# Patient Record
Sex: Female | Born: 1965 | Race: White | Hispanic: No | Marital: Married | Smoking: Never smoker
Health system: Southern US, Community
[De-identification: ages and names within clinical notes are randomized; demographics above are authoritative.]

## PROBLEM LIST (undated history)

## (undated) DIAGNOSIS — O99345 Other mental disorders complicating the puerperium: Secondary | ICD-10-CM

## (undated) DIAGNOSIS — I341 Nonrheumatic mitral (valve) prolapse: Secondary | ICD-10-CM

## (undated) DIAGNOSIS — F53 Postpartum depression: Secondary | ICD-10-CM

## (undated) DIAGNOSIS — O903 Peripartum cardiomyopathy: Secondary | ICD-10-CM

## (undated) DIAGNOSIS — K429 Umbilical hernia without obstruction or gangrene: Secondary | ICD-10-CM

## (undated) DIAGNOSIS — F339 Major depressive disorder, recurrent, unspecified: Secondary | ICD-10-CM

## (undated) DIAGNOSIS — R5383 Other fatigue: Secondary | ICD-10-CM

## (undated) DIAGNOSIS — R002 Palpitations: Secondary | ICD-10-CM

## (undated) DIAGNOSIS — I38 Endocarditis, valve unspecified: Secondary | ICD-10-CM

## (undated) DIAGNOSIS — O24419 Gestational diabetes mellitus in pregnancy, unspecified control: Secondary | ICD-10-CM

## (undated) DIAGNOSIS — Z148 Genetic carrier of other disease: Secondary | ICD-10-CM

## (undated) HISTORY — DX: Nonrheumatic mitral (valve) prolapse: I34.1

## (undated) HISTORY — DX: Genetic carrier of other disease: Z14.8

## (undated) HISTORY — DX: Gestational diabetes mellitus in pregnancy, unspecified control: O24.419

## (undated) HISTORY — DX: Other fatigue: R53.83

## (undated) HISTORY — DX: Palpitations: R00.2

## (undated) HISTORY — DX: Postpartum depression: F53.0

## (undated) HISTORY — DX: Major depressive disorder, recurrent, unspecified: F33.9

## (undated) HISTORY — DX: Umbilical hernia without obstruction or gangrene: K42.9

## (undated) HISTORY — DX: Endocarditis, valve unspecified: I38

## (undated) HISTORY — DX: Peripartum cardiomyopathy: O90.3

## (undated) HISTORY — DX: Other mental disorders complicating the puerperium: O99.345

---

## 2007-05-31 HISTORY — PX: UMBILICAL HERNIA REPAIR: SHX196

## 2007-05-31 HISTORY — PX: ABLATION: SHX5711

## 2009-12-14 DIAGNOSIS — I341 Nonrheumatic mitral (valve) prolapse: Secondary | ICD-10-CM

## 2009-12-14 HISTORY — DX: Nonrheumatic mitral (valve) prolapse: I34.1

## 2010-10-06 ENCOUNTER — Other Ambulatory Visit (HOSPITAL_COMMUNITY)
Admission: RE | Admit: 2010-10-06 | Discharge: 2010-10-06 | Disposition: A | Discharge status: Home / Self Care | Payer: 59 | Source: Ambulatory Visit | Attending: Family Medicine | Admitting: Family Medicine

## 2010-10-06 DIAGNOSIS — Z124 Encounter for screening for malignant neoplasm of cervix: Secondary | ICD-10-CM | POA: Insufficient documentation

## 2011-11-01 ENCOUNTER — Other Ambulatory Visit (HOSPITAL_COMMUNITY)
Admission: RE | Admit: 2011-11-01 | Discharge: 2011-11-01 | Disposition: A | Discharge status: Home / Self Care | Payer: 59 | Source: Ambulatory Visit | Attending: Family Medicine | Admitting: Family Medicine

## 2011-11-01 DIAGNOSIS — Z1159 Encounter for screening for other viral diseases: Secondary | ICD-10-CM | POA: Insufficient documentation

## 2011-11-01 DIAGNOSIS — Z124 Encounter for screening for malignant neoplasm of cervix: Secondary | ICD-10-CM | POA: Insufficient documentation

## 2011-11-14 ENCOUNTER — Other Ambulatory Visit: Payer: Self-pay | Admitting: Family Medicine

## 2011-11-14 DIAGNOSIS — Z1231 Encounter for screening mammogram for malignant neoplasm of breast: Secondary | ICD-10-CM

## 2011-12-07 ENCOUNTER — Ambulatory Visit
Admission: RE | Admit: 2011-12-07 | Discharge: 2011-12-07 | Disposition: A | Discharge status: Home / Self Care | Payer: 59 | Source: Ambulatory Visit | Attending: Family Medicine | Admitting: Family Medicine

## 2011-12-07 DIAGNOSIS — Z1231 Encounter for screening mammogram for malignant neoplasm of breast: Secondary | ICD-10-CM

## 2011-12-20 ENCOUNTER — Other Ambulatory Visit: Payer: Self-pay | Admitting: Family Medicine

## 2011-12-20 DIAGNOSIS — R928 Other abnormal and inconclusive findings on diagnostic imaging of breast: Secondary | ICD-10-CM

## 2011-12-26 ENCOUNTER — Ambulatory Visit
Admission: RE | Admit: 2011-12-26 | Discharge: 2011-12-26 | Disposition: A | Discharge status: Home / Self Care | Payer: 59 | Source: Ambulatory Visit | Attending: Family Medicine | Admitting: Family Medicine

## 2011-12-26 DIAGNOSIS — R928 Other abnormal and inconclusive findings on diagnostic imaging of breast: Secondary | ICD-10-CM

## 2012-12-31 ENCOUNTER — Other Ambulatory Visit: Payer: Self-pay

## 2012-12-31 DIAGNOSIS — Z1231 Encounter for screening mammogram for malignant neoplasm of breast: Secondary | ICD-10-CM

## 2013-02-19 ENCOUNTER — Ambulatory Visit: Payer: 59

## 2013-03-04 ENCOUNTER — Ambulatory Visit
Admission: RE | Admit: 2013-03-04 | Discharge: 2013-03-04 | Disposition: A | Discharge status: Home / Self Care | Payer: BC Managed Care – PPO | Source: Ambulatory Visit

## 2013-03-04 DIAGNOSIS — Z1231 Encounter for screening mammogram for malignant neoplasm of breast: Secondary | ICD-10-CM

## 2013-04-11 ENCOUNTER — Other Ambulatory Visit: Payer: Self-pay | Admitting: Physician Assistant

## 2013-04-11 DIAGNOSIS — R109 Unspecified abdominal pain: Secondary | ICD-10-CM

## 2013-04-15 ENCOUNTER — Ambulatory Visit
Admission: RE | Admit: 2013-04-15 | Discharge: 2013-04-15 | Disposition: A | Discharge status: Home / Self Care | Payer: BC Managed Care – PPO | Source: Ambulatory Visit | Attending: Physician Assistant | Admitting: Physician Assistant

## 2013-04-15 DIAGNOSIS — R109 Unspecified abdominal pain: Secondary | ICD-10-CM

## 2013-04-15 MED ORDER — IOHEXOL 300 MG/ML  SOLN
100.0000 mL | Freq: Once | INTRAMUSCULAR | Status: AC | PRN
  Administered 2013-04-15: 100 mL via INTRAVENOUS

## 2013-08-02 ENCOUNTER — Encounter: Payer: Self-pay | Admitting: Interventional Cardiology

## 2013-09-06 ENCOUNTER — Ambulatory Visit: Payer: 59 | Admitting: Interventional Cardiology

## 2013-10-23 ENCOUNTER — Ambulatory Visit: Payer: BC Managed Care – PPO | Admitting: Interventional Cardiology

## 2013-11-12 ENCOUNTER — Encounter: Payer: Self-pay | Admitting: Interventional Cardiology

## 2013-11-12 ENCOUNTER — Ambulatory Visit (INDEPENDENT_AMBULATORY_CARE_PROVIDER_SITE_OTHER): Payer: BC Managed Care – PPO | Admitting: Interventional Cardiology

## 2013-11-12 VITALS — BP 99/63 | HR 67 | Ht 66.0 in | Wt 137.8 lb

## 2013-11-12 DIAGNOSIS — I059 Rheumatic mitral valve disease, unspecified: Secondary | ICD-10-CM

## 2013-11-12 DIAGNOSIS — I341 Nonrheumatic mitral (valve) prolapse: Secondary | ICD-10-CM

## 2013-11-12 NOTE — Patient Instructions (Signed)
Your physician wants you to follow-up in: 2 years with Dr. Eldridge DaceVaranasi. You will receive a reminder letter in the mail two months in advance. If you don't receive a letter, please call our office to schedule the follow-up appointment.

## 2013-11-12 NOTE — Progress Notes (Signed)
Patient ID: Savannah Schmitt, female   DOB: 12/19/1965, 48 y.o.   MRN: [id number]    [address] [place], [place]  [id number] Phone: [phone] Fax:  [phone]  Date:  11/12/2013   ID:  Savannah Schmitt, DOB 03/20/1966, MRN [id number]  PCP:  Beverley FiedlerANKINS,VICTORIA, MD      History of Present Illness: Savannah GrayerKristin Schmitt is a 48 y.o. 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 She had her last child in 2008. She does not recall having any heart failure symptoms. She doesn't recall being on any regimen of medications for her heart. Review of the record shows that her ejection fraction as always listed above 55% in the past. She does have a thickened mitral valve but no significant mitral valve regurgitation is noted.  Echo in 2014 showed normal left jugular function with mild mitral valve prolapse and mild mitral regurgitation. She denies any symptoms of chest discomfort or shortness of breath. She exercises regularly. She has been feeling well over the past year. No palpitations, orthopnea or PND.    Wt Readings from Last 3 Encounters:  11/12/13 137 lb 12.8 oz (62.506 kg)     Past Medical History  Diagnosis Date  . Umbilical hernia     s/p repair in 2009  . Major depression, recurrent, chronic   . MVP (mitral valve prolapse) 12/14/09    [facility]. ECHO 09/06/12 - LV EF estimated at 55-60%. Mild MVP. Mild mitral valve regurgitation. The mitral regurgitant jet is poteriorly directed.  Pt has a questionable Hx of peripartum cardiomyopathy. Does not recall any heart failure sx.  . Gestational diabetes   . 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 Does not recall any heart failure sx or being on meds for her heart. Records shows that her EF as always listed >55% in the past. Does have a thickened mitral valve but no significant mitral valve regurgitation noted.  .  Palpitations     24 hour Holter monitor, 09/04/06: a) Rare PVCs & PACs.  b) Sx improved despite stopping Metoprolol.    . Valvular heart disease     a) Anterior mitral leaflet prolapse with EF of 66%. (06/02/06)  . Post partum depression     Resolved as of 02/14/07  . Fatigue     Current Outpatient Prescriptions  Medication Sig Dispense Refill  . Fluticasone Propionate (FLONASE NA) Place into the nose daily.      . Multiple Vitamin (MULTIVITAMIN) tablet Take 1 tablet by mouth as directed.      . Probiotic Product (PROBIOTIC DAILY PO)        No current facility-administered medications for this visit.    Allergies:    Allergies  Allergen Reactions  . Wellbutrin [Bupropion] Other (See Comments)    Negative therapeutic response    Social History:  The patient  reports that she has never smoked. She does not have any smokeless tobacco history on file. She reports that she drinks alcohol. She reports that she does not use illicit drugs.   Family History:  The patient's family history includes Colon polyps in her paternal grandfather; Diverticulitis in her father; Hypertension in her other; Melanoma in her father; Other in her daughter.   ROS:  Please see the history of present illness.  No nausea, vomiting.  No fevers, chills.  No focal weakness.  No dysuria.  All other systems reviewed and negative.   PHYSICAL EXAM: VS:  BP 99/63  Pulse 67  Ht 5\' 6"  (1.676 m)  Wt 137 lb 12.8 oz (62.506 kg)  BMI 22.25 kg/m2 Well nourished, well developed, in no acute distress HEENT: normal Neck: no JVD, no carotid bruits Cardiac:  normal S1, S2; RRR;  Lungs:  clear to auscultation bilaterally, no wheezing, rhonchi or rales Abd: soft, nontender, no hepatomegaly Ext: no edema Skin: warm and dry Neuro:   no focal abnormalities noted  EKG:  Normal sinus rhythm, short PR interval, nonspecific ST segment changes     ASSESSMENT AND PLAN:  Mitral valve prolapse  IMAGING: EKG   Harward,Amy  08/27/2012 02:34:07 PM > VARANASI,JAY 08/27/2012 03:12:22 PM > NSR, short PR, NSST  IMAGING: EC Echocardiogram from 2014: Noted above.  Notes: No symptoms of severe mitral regurgitation. No heart failure symptoms. No need for antibiotics at the time of dental procedures.   She needs annual visits with her physician. She sees her PMD at least once a year. If there is any increase in intensity of the murmur or any symptoms of heart failure, would consider repeat evaluation. Otherwise, I will see her back in a few years. She likely needs an echocardiogram in about 3 years.  Is unclear to me whether she actually had peripartum cardiomyopathy. Regardless, her ejection fraction is now normal. She is not planning on having any more children. Would not add anymore medication at this time as her blood pressure would likely not tolerate anything.   Signed, Fredric MareJay S. Varanasi, MD, [facility] 11/12/2013 12:17 PM

## 2014-03-03 ENCOUNTER — Other Ambulatory Visit: Payer: Self-pay

## 2014-03-03 DIAGNOSIS — Z1231 Encounter for screening mammogram for malignant neoplasm of breast: Secondary | ICD-10-CM

## 2014-03-27 ENCOUNTER — Ambulatory Visit
Admission: RE | Admit: 2014-03-27 | Discharge: 2014-03-27 | Disposition: A | Discharge status: Home / Self Care | Payer: BC Managed Care – PPO | Source: Ambulatory Visit

## 2014-03-27 DIAGNOSIS — Z1231 Encounter for screening mammogram for malignant neoplasm of breast: Secondary | ICD-10-CM

## 2014-11-02 IMAGING — CT CT ABDOMEN W/ CM
3 of 5 series · 13 of 36 positions shown, 19 images · IV contrast (READICAT/WATER & [ID] OMNI 300)
Comparison: None.

CLINICAL DATA: Umbilical abdominal pain.

EXAM:
CT ABDOMEN WITH CONTRAST
TECHNIQUE: Multidetector CT imaging of the abdomen was performed using the
standard protocol following bolus administration of intravenous
contrast.
CONTRAST:  100mL OMNIPAQUE IOHEXOL 300 MG/ML  SOLN

[Series 3: abdomen with · axial · 0.70mm/px · z∈[-246,-46]mm · 5 of 61 slices shown, 10 images]
[im 11/61  soft-tissue]
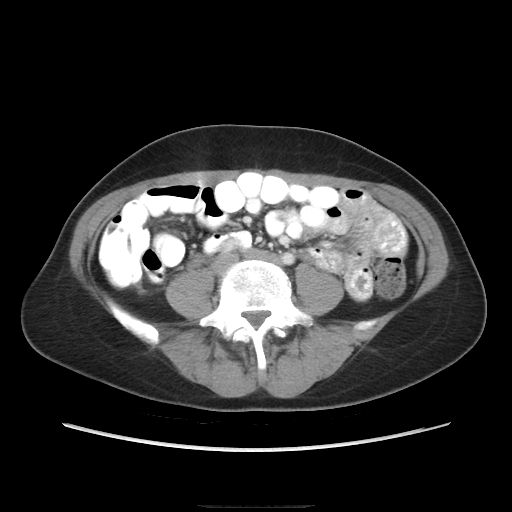
[im 11/61  bone]
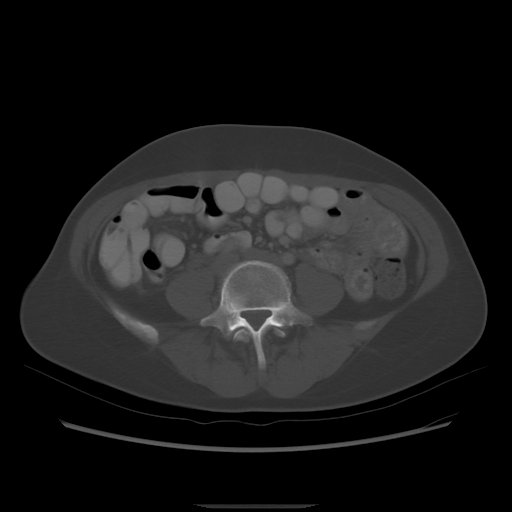
[im 21/61  soft-tissue]
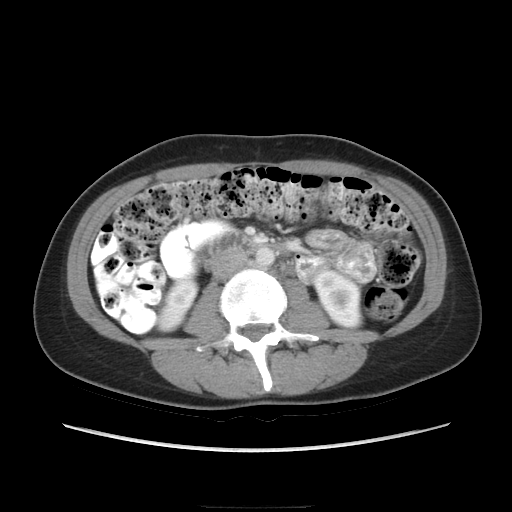
[im 21/61  lung]
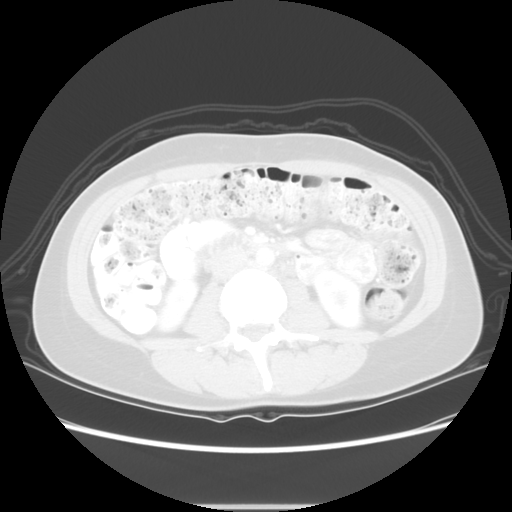
[im 31/61  soft-tissue]
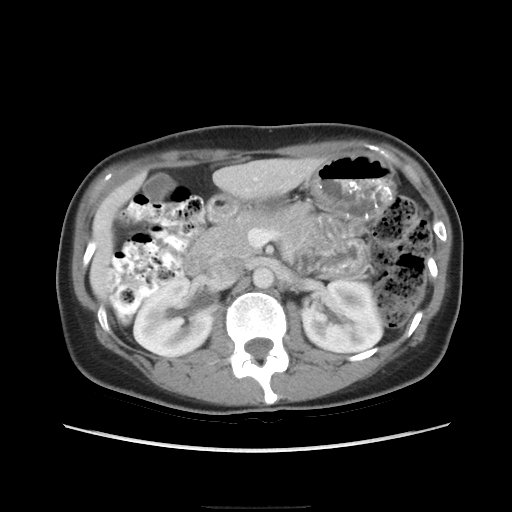
[im 31/61  lung]
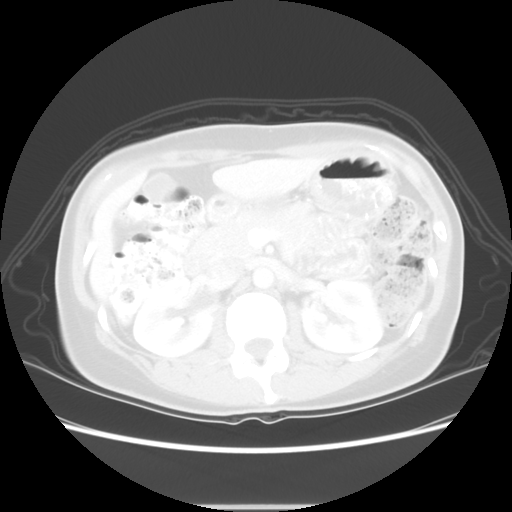
[im 41/61  soft-tissue]
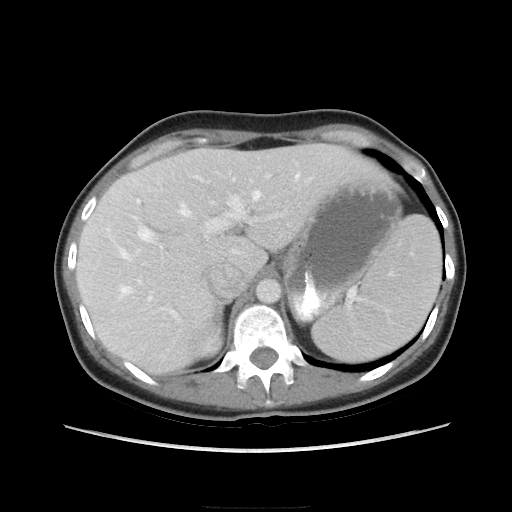
[im 41/61  lung]
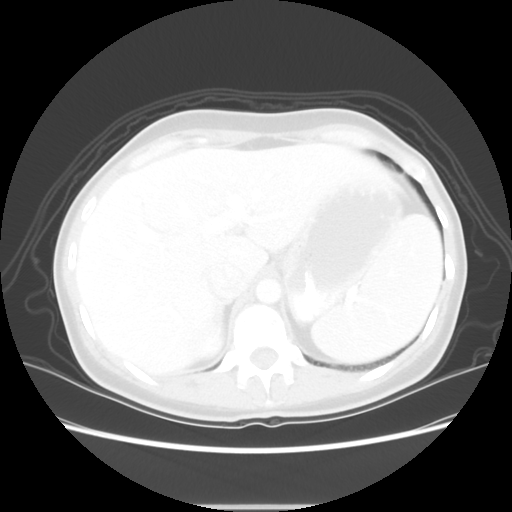
[im 51/61  soft-tissue]
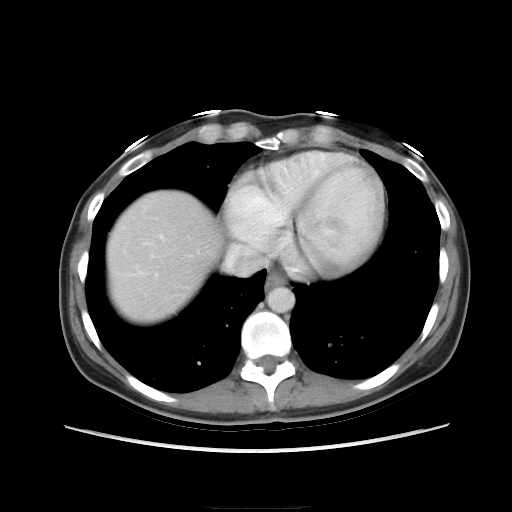
[im 51/61  lung]
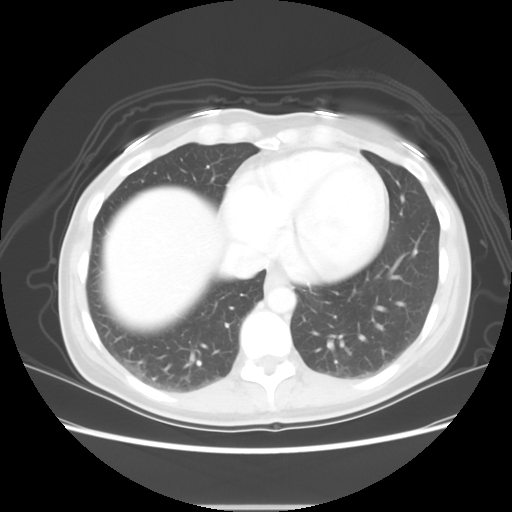

[Series 601: coronal body · coronal · 0.70mm/px · 1 of 98 slices shown, 2 images]
[im 33/98  soft-tissue]
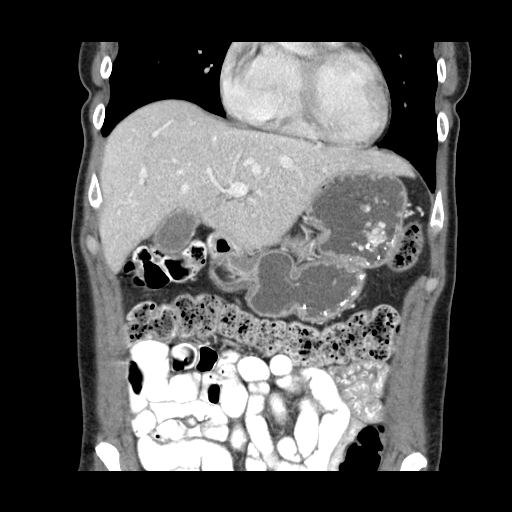
[im 33/98  bone]
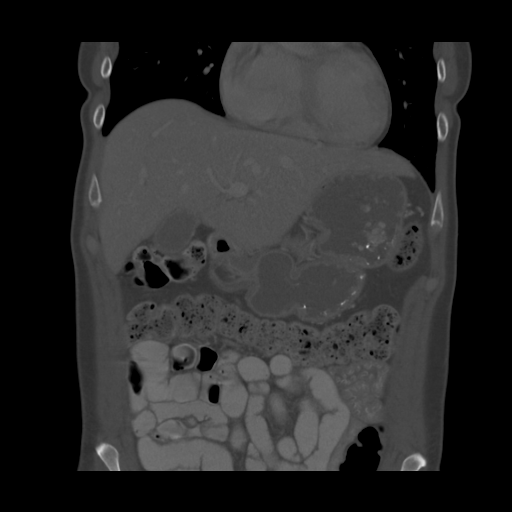

[Series 602: sagittal body · sagittal · 0.70mm/px · 7 of 139 slices shown]
[im 17/139  soft-tissue]
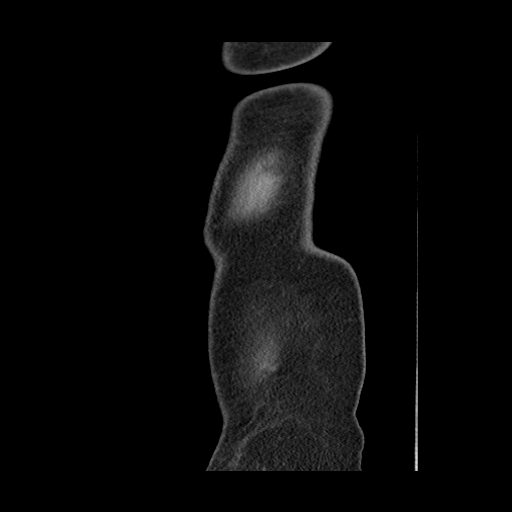
[im 33/139  soft-tissue]
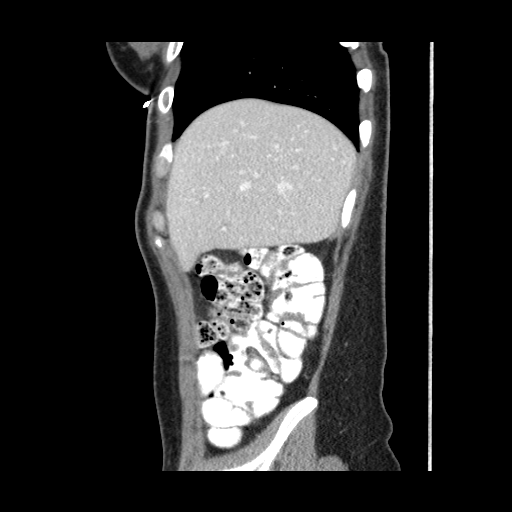
[im 49/139  soft-tissue]
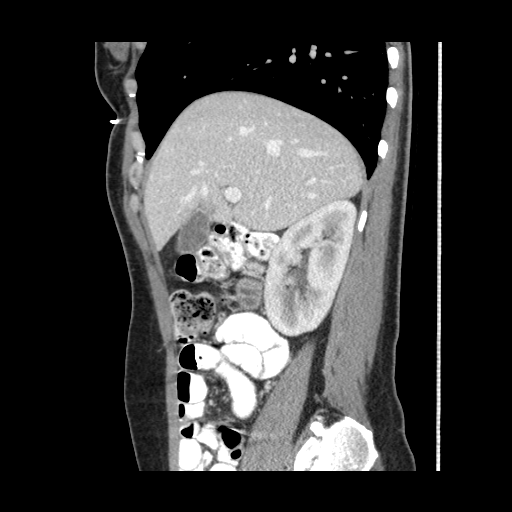
[im 65/139  soft-tissue]
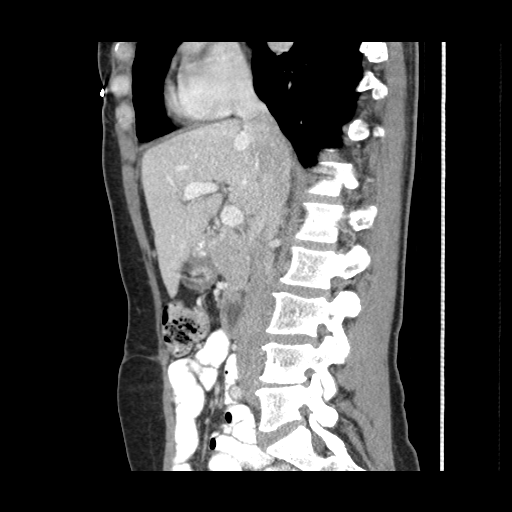
[im 82/139  soft-tissue]
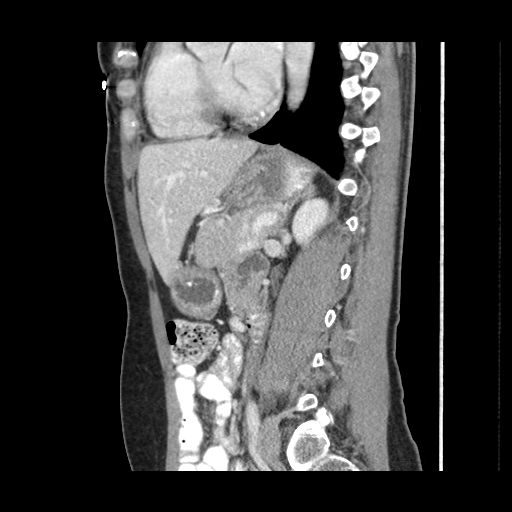
[im 98/139  soft-tissue]
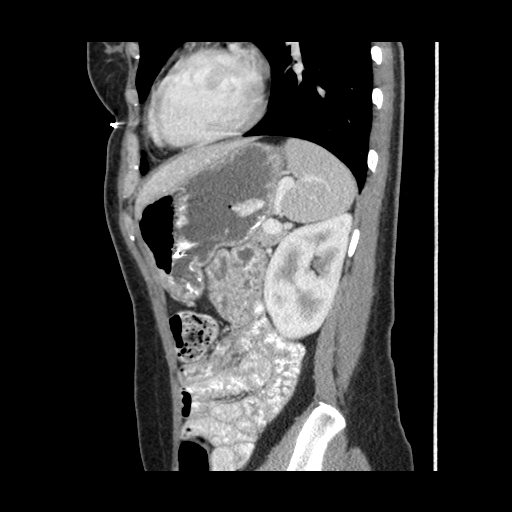
[im 114/139  soft-tissue]
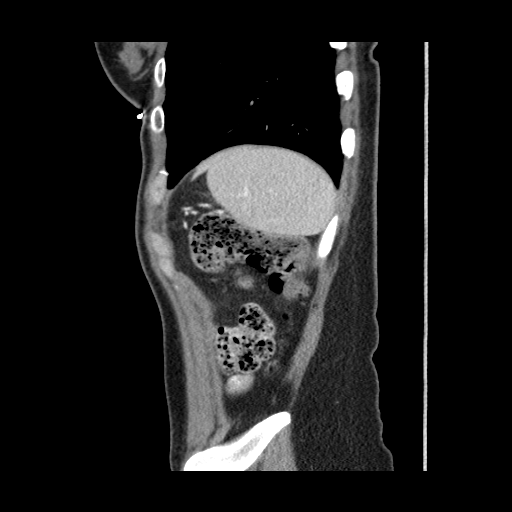

[13 of 36 positions shown; findings below may reference images not displayed]

FINDINGS: Lung bases are clear. No effusions. Heart is normal size.

Liver, gallbladder, spleen, pancreas, adrenals and kidneys are
unremarkable. No abnormality in the region of the umbilicus or
adjacent soft tissues. Moderate stool in the visualize colon. Large
and small bowel otherwise unremarkable. Stomach is unremarkable. No
free fluid, free air or adenopathy in the abdomen. Aorta is normal
caliber.

No acute bony abnormality.
IMPRESSION: No acute findings or significant abnormality in the abdomen. No
explanation for the patient's umbilical pain.

## 2015-03-17 ENCOUNTER — Other Ambulatory Visit: Payer: Self-pay

## 2015-03-17 DIAGNOSIS — Z1231 Encounter for screening mammogram for malignant neoplasm of breast: Secondary | ICD-10-CM

## 2015-04-22 ENCOUNTER — Ambulatory Visit: Payer: Self-pay

## 2015-06-05 ENCOUNTER — Ambulatory Visit
Admission: RE | Admit: 2015-06-05 | Discharge: 2015-06-05 | Disposition: A | Discharge status: Home / Self Care | Payer: Managed Care, Other (non HMO) | Source: Ambulatory Visit

## 2015-06-05 DIAGNOSIS — Z1231 Encounter for screening mammogram for malignant neoplasm of breast: Secondary | ICD-10-CM

## 2016-05-13 ENCOUNTER — Other Ambulatory Visit: Payer: Self-pay | Admitting: Family Medicine

## 2016-05-13 DIAGNOSIS — Z1231 Encounter for screening mammogram for malignant neoplasm of breast: Secondary | ICD-10-CM

## 2016-06-17 ENCOUNTER — Ambulatory Visit
Admission: RE | Admit: 2016-06-17 | Discharge: 2016-06-17 | Disposition: A | Discharge status: Home / Self Care | Payer: Managed Care, Other (non HMO) | Source: Ambulatory Visit | Attending: Family Medicine | Admitting: Family Medicine

## 2016-06-17 DIAGNOSIS — Z1231 Encounter for screening mammogram for malignant neoplasm of breast: Secondary | ICD-10-CM

## 2017-07-27 ENCOUNTER — Ambulatory Visit
Admission: RE | Admit: 2017-07-27 | Discharge: 2017-07-27 | Disposition: A | Discharge status: Home / Self Care | Payer: Managed Care, Other (non HMO) | Source: Ambulatory Visit | Attending: Family Medicine | Admitting: Family Medicine

## 2017-07-27 ENCOUNTER — Other Ambulatory Visit: Payer: Self-pay | Admitting: Family Medicine

## 2017-07-27 DIAGNOSIS — N63 Unspecified lump in unspecified breast: Secondary | ICD-10-CM

## 2017-08-01 ENCOUNTER — Other Ambulatory Visit: Payer: Managed Care, Other (non HMO)

## 2017-08-04 ENCOUNTER — Ambulatory Visit
Admission: RE | Admit: 2017-08-04 | Discharge: 2017-08-04 | Disposition: A | Discharge status: Home / Self Care | Payer: Managed Care, Other (non HMO) | Source: Ambulatory Visit | Attending: Family Medicine | Admitting: Family Medicine

## 2017-08-04 DIAGNOSIS — N63 Unspecified lump in unspecified breast: Secondary | ICD-10-CM

## 2018-08-01 ENCOUNTER — Other Ambulatory Visit: Payer: Self-pay | Admitting: Family Medicine

## 2018-08-01 DIAGNOSIS — Z1231 Encounter for screening mammogram for malignant neoplasm of breast: Secondary | ICD-10-CM

## 2018-08-29 ENCOUNTER — Ambulatory Visit: Payer: Managed Care, Other (non HMO)

## 2018-10-19 ENCOUNTER — Other Ambulatory Visit: Payer: Self-pay

## 2018-10-19 ENCOUNTER — Ambulatory Visit
Admission: RE | Admit: 2018-10-19 | Discharge: 2018-10-19 | Disposition: A | Discharge status: Home / Self Care | Payer: Managed Care, Other (non HMO) | Source: Ambulatory Visit | Attending: Family Medicine | Admitting: Family Medicine

## 2018-10-19 DIAGNOSIS — Z1231 Encounter for screening mammogram for malignant neoplasm of breast: Secondary | ICD-10-CM | POA: Diagnosis not present

## 2018-11-21 DIAGNOSIS — Z86018 Personal history of other benign neoplasm: Secondary | ICD-10-CM | POA: Diagnosis not present

## 2018-11-21 DIAGNOSIS — D2262 Melanocytic nevi of left upper limb, including shoulder: Secondary | ICD-10-CM | POA: Diagnosis not present

## 2018-11-21 DIAGNOSIS — D225 Melanocytic nevi of trunk: Secondary | ICD-10-CM | POA: Diagnosis not present

## 2018-11-21 DIAGNOSIS — D485 Neoplasm of uncertain behavior of skin: Secondary | ICD-10-CM | POA: Diagnosis not present

## 2018-11-21 DIAGNOSIS — Z808 Family history of malignant neoplasm of other organs or systems: Secondary | ICD-10-CM | POA: Diagnosis not present

## 2018-12-18 DIAGNOSIS — Z8349 Family history of other endocrine, nutritional and metabolic diseases: Secondary | ICD-10-CM | POA: Diagnosis not present

## 2018-12-18 DIAGNOSIS — F324 Major depressive disorder, single episode, in partial remission: Secondary | ICD-10-CM | POA: Diagnosis not present

## 2018-12-20 DIAGNOSIS — D485 Neoplasm of uncertain behavior of skin: Secondary | ICD-10-CM | POA: Diagnosis not present

## 2018-12-20 DIAGNOSIS — L905 Scar conditions and fibrosis of skin: Secondary | ICD-10-CM | POA: Diagnosis not present

## 2018-12-25 DIAGNOSIS — F324 Major depressive disorder, single episode, in partial remission: Secondary | ICD-10-CM | POA: Diagnosis not present

## 2019-01-08 DIAGNOSIS — G4485 Primary stabbing headache: Secondary | ICD-10-CM | POA: Diagnosis not present

## 2019-01-28 ENCOUNTER — Telehealth: Payer: Self-pay | Admitting: *Deleted

## 2019-01-28 NOTE — Telephone Encounter (Signed)
Called spoke with patient. She states that she was told by her primary we would be calling for appointment. She is scheduled with Dr. Vaughan Browner at 1000 on Thursday 01/31/19. Nothing further is needed at this time.

## 2019-01-28 NOTE — Telephone Encounter (Signed)
-----   Message from Marshell Garfinkel, MD sent at 01/28/2019  3:07 PM EDT ----- Regarding: RE: Alpha 1 Antytripsen Ok. Thanks. We will make an appointment to see [place]. Triage- Please call and make new consult appointment.  ----- Message ----- From: Clarene Essex, Counselor Sent: 01/28/2019   2:56 PM EDT To: Brand Males, MD, Marshell Garfinkel, MD Subject: Alpha 1 Antytripsen                            This is the patient with A1A.  She has supposedly tested positive at her PCP office and was referred to [place].  I do not have any referral information other than name, as she called following up on the referral.  Santiago Glad

## 2019-01-30 ENCOUNTER — Encounter: Payer: BLUE CROSS/BLUE SHIELD | Admitting: Genetic Counselor

## 2019-01-30 ENCOUNTER — Other Ambulatory Visit: Payer: BLUE CROSS/BLUE SHIELD

## 2019-01-31 ENCOUNTER — Institutional Professional Consult (permissible substitution): Payer: Self-pay | Admitting: Pulmonary Disease

## 2019-01-31 ENCOUNTER — Encounter: Payer: Self-pay | Admitting: Pulmonary Disease

## 2019-01-31 ENCOUNTER — Ambulatory Visit (INDEPENDENT_AMBULATORY_CARE_PROVIDER_SITE_OTHER): Payer: BC Managed Care – PPO | Admitting: Pulmonary Disease

## 2019-01-31 ENCOUNTER — Ambulatory Visit (INDEPENDENT_AMBULATORY_CARE_PROVIDER_SITE_OTHER): Payer: BC Managed Care – PPO

## 2019-01-31 ENCOUNTER — Other Ambulatory Visit: Payer: Self-pay

## 2019-01-31 VITALS — BP 106/66 | HR 68 | Ht 65.0 in | Wt 151.4 lb

## 2019-01-31 DIAGNOSIS — R0602 Shortness of breath: Secondary | ICD-10-CM | POA: Diagnosis not present

## 2019-01-31 LAB — COMPREHENSIVE METABOLIC PANEL
ALT: 13 U/L (ref 0–35)
AST: 13 U/L (ref 0–37)
Albumin: 4.5 g/dL (ref 3.5–5.2)
Alkaline Phosphatase: 39 U/L (ref 39–117)
BUN: 17 mg/dL (ref 6–23)
CO2: 28 mEq/L (ref 19–32)
Calcium: 9.4 mg/dL (ref 8.4–10.5)
Chloride: 105 mEq/L (ref 96–112)
Creatinine, Ser: 0.75 mg/dL (ref 0.40–1.20)
GFR: 81.44 mL/min (ref >60.00)
Glucose, Bld: 87 mg/dL (ref 70–99)
Potassium: 3.9 mEq/L (ref 3.5–5.1)
Sodium: 138 mEq/L (ref 135–145)
Total Bilirubin: 0.5 mg/dL (ref 0.2–1.2)
Total Protein: 6.9 g/dL (ref 6.0–8.3)

## 2019-01-31 LAB — CBC WITH DIFFERENTIAL/PLATELET
Basophils Absolute: 0 10*3/uL (ref 0.0–0.1)
Basophils Relative: 0.6 % (ref 0.0–3.0)
Eosinophils Absolute: 0.1 10*3/uL (ref 0.0–0.7)
Eosinophils Relative: 1.2 % (ref 0.0–5.0)
HCT: 39.4 % (ref 36.0–46.0)
Hemoglobin: 13 g/dL (ref 12.0–15.0)
Lymphocytes Relative: 23.5 % (ref 12.0–46.0)
Lymphs Abs: 1.4 10*3/uL (ref 0.7–4.0)
MCHC: 33.1 g/dL (ref 30.0–36.0)
MCV: 96.4 fl (ref 78.0–100.0)
Monocytes Absolute: 0.4 10*3/uL (ref 0.1–1.0)
Monocytes Relative: 7.1 % (ref 3.0–12.0)
Neutro Abs: 4 10*3/uL (ref 1.4–7.7)
Neutrophils Relative %: 67.6 % (ref 43.0–77.0)
Platelets: 172 10*3/uL (ref 150.0–400.0)
RBC: 4.09 Mil/uL (ref 3.87–5.11)
RDW: 14.8 % (ref 11.5–15.5)
WBC: 5.9 10*3/uL (ref 4.0–10.5)

## 2019-01-31 NOTE — Addendum Note (Signed)
Addended by: Suzzanne Cloud E on: 01/31/2019 11:28 AM   Modules accepted: Orders

## 2019-01-31 NOTE — Patient Instructions (Addendum)
We will get some labs today including CBC with differential, alpha-1 antitrypsin levels and phenotype, comprehensive metabolic panel We will schedule you for chest x-ray and pulmonary function tests  Follow-up in 1 year.

## 2019-01-31 NOTE — Addendum Note (Signed)
Addended by: Eileen Stanford on: 01/31/2019 11:37 AM   Modules accepted: Orders

## 2019-01-31 NOTE — Progress Notes (Signed)
Savannah Schmitt    [id number]    1966/01/30  Primary Care Physician:Rankins, Fanny Dance, MD  Referring Physician: Clayborn Heron, MD [address] [place],  [place] [address]  Chief complaint: Consult for evaluation of alpha-1 antitrypsin carrier status  HPI: 53 year old with past medical history of mitral valve prolapse, peripartum cardiomyopathy Has family history of alpha-1 antitrypsin.  Father is ZZ phenotype and is on replacement therapy She had herself tested at primary care and was diagnosed with MZ status. Referred here for evaluation of her lungs and counseling  She is a never smoker.  She has mild dyspnea on exertion with no cough, sputum production, wheezing No evidence of liver issues.  She does not drink alcohol.  No symptoms of skin rash, panniculitis  Pets: 2 cats, dog Occupation: Physical therapist Exposures: No known exposures.  No mold, hot tub, Jacuzzi, humidifier Smoking history: Never smoker.  Exposed to secondhand smoke as a child Travel history: Really from [place].  Lived in [place] for the last 8 years Relevant family history: Father with ZZ alpha-1 antitrypsin deficiency  Outpatient Encounter Medications as of 01/31/2019  Medication Sig  . Fluticasone Propionate (FLONASE NA) Place into the nose daily.  . Multiple Vitamin (MULTIVITAMIN) tablet Take 1 tablet by mouth as directed.  . sertraline (ZOLOFT) 50 MG tablet Take 50 mg by mouth at bedtime. Takes 1/2 tab at bedtime  . Probiotic Product (PROBIOTIC DAILY PO)    No facility-administered encounter medications on file as of 01/31/2019.     Allergies as of 01/31/2019 - Review Complete 01/31/2019  Allergen Reaction Noted  . Wellbutrin [bupropion] Other (See Comments) 11/12/2013    Past Medical History:  Diagnosis Date  . Fatigue   . Gestational diabetes   . Major depression, recurrent, chronic (HCC)   . MVP (mitral valve prolapse) 12/14/09   Mild MVP - Dr Cheryl Flash at [facility] Kitchen ECHO 09/06/12 - LV EF estimated at 55-60%. Mild MVP. Mild mitral valve regurgitation. The mitral regurgitant jet is poteriorly directed.  Pt has a questionable Hx of peripartum cardiomyopathy. Does not recall any heart failure sx.  . Palpitations    24 hour Holter monitor, 09/04/06: a) Rare PVCs & PACs.  b) Sx improved despite stopping Metoprolol.    . Peripartum cardiomyopathy    Questionable Hx of peripartum cardiomyopathy. Does not recall any heart failure sx or being on meds for her heart. Records shows that her EF as always listed >55% in the past. Does have a thickened mitral valve but no significant mitral valve regurgitation noted.  Marland Kitchen Post partum depression    Resolved as of 02/14/07  . Umbilical hernia    s/p repair in 2009  . 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 (06/02/06)    Past Surgical History:  Procedure Laterality Date  . ABLATION  2009   Uterine ablation  . UMBILICAL HERNIA REPAIR  2009    Family History  Problem Relation Age of Onset  . Melanoma Father   . Diverticulitis Father   . Colon polyps Paternal Grandfather   . Other Daughter        Oldest daughter, 23 yrs old (as of 07/2012), has Tri-X Syndrome.  Marland Kitchen Hypertension Other        Family Hx of HTN    Social History   Socioeconomic History  . Marital status: Married    Spouse name: Not on file  .  Number of children: Not on file  . Years of education: Not on file  . Highest education level: Not on file  Occupational History  . Not on file  Social Needs  . Financial resource strain: Not on file  . Food insecurity    Worry: Not on file    Inability: Not on file  . Transportation needs    Medical: Not on file    Non-medical: Not on file  Tobacco Use  . Smoking status: Never Smoker  . Smokeless tobacco: Never Used  Substance and Sexual Activity  . Alcohol use: Yes    Comment: Social  . Drug use: No  . Sexual activity: Not on file  Lifestyle  . Physical  activity    Days per week: Not on file    Minutes per session: Not on file  . Stress: Not on file  Relationships  . Social Herbalist on phone: Not on file    Gets together: Not on file    Attends religious service: Not on file    Active member of club or organization: Not on file    Attends meetings of clubs or organizations: Not on file    Relationship status: Not on file  . Intimate partner violence    Fear of current or ex partner: Not on file    Emotionally abused: Not on file    Physically abused: Not on file    Forced sexual activity: Not on file  Other Topics Concern  . Not on file  Social History Narrative  . Not on file    Review of systems: Review of Systems  Constitutional: Negative for fever and chills.  HENT: Negative.   Eyes: Negative for blurred vision.  Respiratory: as per HPI  Cardiovascular: Negative for chest pain and palpitations.  Gastrointestinal: Negative for vomiting, diarrhea, blood per rectum. Genitourinary: Negative for dysuria, urgency, frequency and hematuria.  Musculoskeletal: Negative for myalgias, back pain and joint pain.  Skin: Negative for itching and rash.  Neurological: Negative for dizziness, tremors, focal weakness, seizures and loss of consciousness.  Endo/Heme/Allergies: Negative for environmental allergies.  Psychiatric/Behavioral: Negative for depression, suicidal ideas and hallucinations.  All other systems reviewed and are negative.  Physical Exam: Blood pressure 106/66, pulse 68, height 5\' 5"  (1.651 m), weight 151 lb 6.4 oz (68.7 kg), SpO2 99 %. Gen:      No acute distress HEENT:  EOMI, sclera anicteric Neck:     No masses; no thyromegaly Lungs:    Clear to auscultation bilaterally; normal respiratory effort CV:         Regular rate and rhythm; no murmurs Abd:      + bowel sounds; soft, non-tender; no palpable masses, no distension Ext:    No edema; adequate peripheral perfusion Skin:      Warm and dry; no rash  Neuro: alert and oriented x 3 Psych: normal mood and affect  Data Reviewed:  Labs: Alpha-1 antitrypsin 01/11/2019-single Z mutation identified.  No S mutation identified.  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 Schedule chest x-ray, pulmonary function test and hepatic panel to evaluate lung and liver function.  Answered questions about diagnosis and follow-up.  She is interested in getting her children tested as well. Referred her to [email or web address] website for more patient information Advised to maintain a healthy lifestyle, avoid inhalation exposures and alcohol  Plan/Recommendations: - CBC, alpha-1 antitrypsin levels, hepatic panel - Chest x-ray, PFTs  Chilton GreathousePraveen Lucy Boardman MD [facility] 01/31/2019, 10:44 AM  CC: Rankins, Fanny DanceVictoria R, MD

## 2019-02-01 LAB — ALPHA-1-ANTITRYPSIN: A-1 Antitrypsin, Ser: 88 mg/dL (ref 83–199)

## 2019-02-10 LAB — ALPHA-1 ANTITRYPSIN PHENOTYPE: A-1 Antitrypsin, Ser: 77 mg/dL — ABNORMAL LOW (ref 83–199)

## 2019-02-20 ENCOUNTER — Other Ambulatory Visit: Payer: Self-pay

## 2019-02-20 ENCOUNTER — Encounter: Payer: Self-pay | Admitting: Neurology

## 2019-02-20 ENCOUNTER — Ambulatory Visit (INDEPENDENT_AMBULATORY_CARE_PROVIDER_SITE_OTHER): Payer: BC Managed Care – PPO | Admitting: Neurology

## 2019-02-20 VITALS — BP 112/64 | HR 76 | Temp 97.8°F | Ht 65.0 in | Wt 152.0 lb

## 2019-02-20 DIAGNOSIS — G441 Vascular headache, not elsewhere classified: Secondary | ICD-10-CM

## 2019-02-20 DIAGNOSIS — G44039 Episodic paroxysmal hemicrania, not intractable: Secondary | ICD-10-CM

## 2019-02-20 DIAGNOSIS — R51 Headache: Secondary | ICD-10-CM

## 2019-02-20 DIAGNOSIS — R519 Headache, unspecified: Secondary | ICD-10-CM

## 2019-02-20 MED ORDER — INDOMETHACIN 25 MG PO CAPS: 25.0000 mg | ORAL_CAPSULE | Freq: Three times a day (TID) | ORAL | 3 refills | Status: AC | PRN

## 2019-02-20 NOTE — Progress Notes (Signed)
[facility]    Provider:  Dr Lucia GaskinsAhern Requesting Provider: Barbaraann Barthelankins, Fanny DanceVictoria R, MD Primary Care Provider:  Clayborn Heronankins, Victoria R, MD  CC:  Stabbing headaches  HPI:  Savannah GrayerKristin Schmitt is a 53 y.o. female here as requested by Rankins, Fanny DanceVictoria R, MD for stabbing headaches. Symptoms are episodic, comes and goes, started having in 20s. Prior to tahe she started having symptoms in her periphery, kaleidoscope on the right, was dxed with migraine aura. Will get that first then the pain sometimes. Worsened after having childre, she started having stabbing pains in th temporal artery, in the right parietal area, brief, shocking, could have happened int he cartilage on the right, does not shoot up from the back, counter pressure helps, when it was bad she did have red eyes and tearing eyes (unclear if one or both but took one contact out of that eye). Worst is 2-3x a minute and can last on and off, more frequent, can last on and off all day, was the worst in august most severe.  No other focal neurologic deficits, associated symptoms, inciting events or modifiable factors.  Reviewed notes, labs and imaging from outside physicians, which showed:  Cbc w/diff,cmp normal  Review of Systems: Patient complains of symptoms per HPI as well as the following symptoms: headache. Pertinent negatives and positives per HPI. All others negative.   Social History   Socioeconomic History  . Marital status: Married    Spouse name: Not on file  . Number of children: Not on file  . Years of education: Not on file  . Highest education level: Not on file  Occupational History  . Not on file  Social Needs  . Financial resource strain: Not on file  . Food insecurity    Worry: Not on file    Inability: Not on file  . Transportation needs    Medical: Not on file    Non-medical: Not on file  Tobacco Use  . Smoking status: Never Smoker  . Smokeless tobacco: Never Used  Substance and Sexual Activity  .  Alcohol use: Yes    Comment: Social  . Drug use: No  . Sexual activity: Not on file  Lifestyle  . Physical activity    Days per week: Not on file    Minutes per session: Not on file  . Stress: Not on file  Relationships  . Social Musicianconnections    Talks on phone: Not on file    Gets together: Not on file    Attends religious service: Not on file    Active member of club or organization: Not on file    Attends meetings of clubs or organizations: Not on file    Relationship status: Not on file  . Intimate partner violence    Fear of current or ex partner: Not on file    Emotionally abused: Not on file    Physically abused: Not on file    Forced sexual activity: Not on file  Other Topics Concern  . Not on file  Social History Narrative   Lives at home with family   Right handed   Caffeine: 1-2 cups coffee in the morning and 1 large soda per day    Family History  Problem Relation Age of Onset  . Melanoma Father   . Diverticulitis Father   . Alpha-1 antitrypsin deficiency Father   . Colon polyps Paternal Grandfather   . Other Daughter        Oldest daughter, 10 yrs  old (as of 07/2012), has Tri-X Syndrome.  Marland Kitchen Hypertension Other        Family Hx of HTN  . Headache Neg Hx   . Migraines Neg Hx     Past Medical History:  Diagnosis Date  . Alpha-1-antitrypsin deficiency carrier   . Fatigue   . Gestational diabetes   . Major depression, recurrent, chronic (HCC)   . MVP (mitral valve prolapse) 12/14/09   Mild MVP - Dr Cheryl Flash at [facility].Marland Kitchen ECHO 09/06/12 - LV EF estimated at 55-60%. Mild MVP. Mild mitral valve regurgitation. The mitral regurgitant jet is poteriorly directed.  Pt has a questionable Hx of peripartum cardiomyopathy. Does not recall any heart failure sx.  . Palpitations    24 hour Holter monitor, 09/04/06: a) Rare PVCs & PACs.  b) Sx improved despite stopping Metoprolol.    . Peripartum cardiomyopathy    Questionable Hx of peripartum cardiomyopathy. Does not  recall any heart failure sx or being on meds for her heart. Records shows that her EF as always listed >55% in the past. Does have a thickened mitral valve but no significant mitral valve regurgitation noted.  Marland Kitchen Post partum depression    Resolved as of 02/14/07  . Umbilical hernia    s/p repair in 2009  . 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 (06/02/06)    Patient Active Problem List   Diagnosis Date Noted  . MVP (mitral valve prolapse) 12/14/2009    Past Surgical History:  Procedure Laterality Date  . ABLATION  2009   Uterine ablation  . UMBILICAL HERNIA REPAIR  2009    Current Outpatient Medications  Medication Sig Dispense Refill  . Fluticasone Propionate (FLONASE NA) Place into the nose daily.    . Multiple Vitamin (MULTIVITAMIN) tablet Take 1 tablet by mouth as directed.    . sertraline (ZOLOFT) 50 MG tablet Take 25 mg by mouth at bedtime.     . indomethacin (INDOCIN) 25 MG capsule Take 1-3 capsules (25-75 mg total) by mouth 3 (three) times daily as needed. 270 capsule 3  . Probiotic Product (PROBIOTIC DAILY PO)      No current facility-administered medications for this visit.     Allergies as of 02/20/2019 - Review Complete 02/20/2019  Allergen Reaction Noted  . Wellbutrin [bupropion] Other (See Comments) 11/12/2013    Vitals: BP 112/64 (BP Location: Right Arm, Patient Position: Sitting)   Pulse 76   Temp 97.8 F (36.6 C)   Ht 5\' 5"  (1.651 m)   Wt 152 lb (68.9 kg)   BMI 25.29 kg/m  Last Weight:  Wt Readings from Last 1 Encounters:  02/20/19 152 lb (68.9 kg)   Last Height:   Ht Readings from Last 1 Encounters:  02/20/19 5\' 5"  (1.651 m)     Physical exam: Exam: Gen: NAD, conversant, well nourised, well groomed                     CV: RRR, no MRG. No Carotid Bruits. No peripheral edema, warm, nontender Eyes: Conjunctivae clear without exudates or hemorrhage  Neuro: Detailed Neurologic Exam  Speech:    Speech is  normal; fluent and spontaneous with normal comprehension.  Cognition:    The patient is oriented to person, place, and time;     recent and remote memory intact;     language fluent;     normal attention, concentration,     fund of knowledge Cranial  Nerves:    The pupils are equal, round, and reactive to light. The fundi are normal and spontaneous venous pulsations are present. Visual fields are full to finger confrontation. Extraocular movements are intact. Trigeminal sensation is intact and the muscles of mastication are normal. The face is symmetric. The palate elevates in the midline. Hearing intact. Voice is normal. Shoulder shrug is normal. The tongue has normal motion without fasciculations.   Coordination:    Normal finger to nose and heel to shin. Normal rapid alternating movements.   Gait:    Heel-toe and tandem gait are normal.   Motor Observation:    No asymmetry, no atrophy, and no involuntary movements noted. Tone:    Normal muscle tone.    Posture:    Posture is normal. normal erect    Strength:    Strength is V/V in the upper and lower limbs.      Sensation: intact to LT     Reflex Exam:  DTR's:    Deep tendon reflexes in the upper and lower extremities are normal bilaterally.   Toes:    The toes are downgoing bilaterally.   Clonus:    Clonus is absent.    Assessment/Plan:  Lovely patient, stabbing like headaches only on right, autonomic symptoms. 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  Mri brain w/wo contrast: to look for lesions, space occupying masses of the pituitary gland and posterior fossa which can be seen causing this condition, need to rule out Paroxysmal Hemicrania: Trial of indomethacin  Discussed: To prevent or relieve headaches, try the following: Cool Compress. Lie down and place a cool compress on your head.  Avoid headache triggers. If certain foods or odors seem to have triggered your migraines in  the past, avoid them. A headache diary might help you identify triggers.  Include physical activity in your daily routine. Try a daily walk or other moderate aerobic exercise.  Manage stress. Find healthy ways to cope with the stressors, such as delegating tasks on your to-do list.  Practice relaxation techniques. Try deep breathing, yoga, massage and visualization.  Eat regularly. Eating regularly scheduled meals and maintaining a healthy diet might help prevent headaches. Also, drink plenty of fluids.  Follow a regular sleep schedule. Sleep deprivation might contribute to headaches Consider biofeedback. With this mind-body technique, you learn to control certain bodily functions - such as muscle tension, heart rate and blood pressure - to prevent headaches or reduce headache pain.    Proceed to emergency room if you experience new or worsening symptoms or symptoms do not resolve, if you have new neurologic symptoms or if headache is severe, or for any concerning symptom.   Provided education and documentation from American headache Society toolbox including articles on: chronic migraine medication overuse headache, chronic migraines, prevention of migraines, behavioral and other nonpharmacologic treatments for headache.   Orders Placed This Encounter  Procedures  . MR BRAIN W WO CONTRAST   Meds ordered this encounter  Medications  . indomethacin (INDOCIN) 25 MG capsule    Sig: Take 1-3 capsules (25-75 mg total) by mouth 3 (three) times daily as needed.    Dispense:  270 capsule    Refill:  3   Discussed: To prevent or relieve headaches, try the following: Cool Compress. Lie down and place a cool compress on your head.  Avoid headache triggers. If certain foods or odors seem to have triggered your migraines in the past, avoid them. A headache diary might help  you identify triggers.  Include physical activity in your daily routine. Try a daily walk or other moderate aerobic exercise.   Manage stress. Find healthy ways to cope with the stressors, such as delegating tasks on your to-do list.  Practice relaxation techniques. Try deep breathing, yoga, massage and visualization.  Eat regularly. Eating regularly scheduled meals and maintaining a healthy diet might help prevent headaches. Also, drink plenty of fluids.  Follow a regular sleep schedule. Sleep deprivation might contribute to headaches Consider biofeedback. With this mind-body technique, you learn to control certain bodily functions - such as muscle tension, heart rate and blood pressure - to prevent headaches or reduce headache pain.    Proceed to emergency room if you experience new or worsening symptoms or symptoms do not resolve, if you have new neurologic symptoms or if headache is severe, or for any concerning symptom.   Provided education and documentation    Cc: Rankins, Bill Salinas, MD   A total of 60 minutes was spent face-to-face with this patient. Over half this time was spent on counseling patient on the  1. Episodic paroxysmal hemicrania, not intractable   2. Other vascular headache   3. Worst headache of life    diagnosis and different diagnostic and therapeutic options, counseling and coordination of care, risks ans benefits of management, compliance, or risk factor reduction and education.     Sarina Ill, MD  [facility] [address] [place], [place] [address]  Phone [phone] Fax [phone]

## 2019-02-20 NOTE — Patient Instructions (Addendum)
The starting dose of indomethacin for adults and older adolescents (?51 years of age) is 75 mg daily in three divided doses (ie, 25 mg three times a day). The indomethacin dose should be increased to 150 mg daily in three divided doses for 3 to 10 days if there is an incomplete response to the starting dose after 3 days. The dose should be further increased to 225 mg daily in three divided doses for 10 days for partial responders if the index of suspicion is high.  Indomethacin-Responsive Headache, Adult An indomethacin-responsive headache is a headache that gets better when you take indomethacin. Indomethacin is a kind of NSAID (nonsteroidal anti-inflammatory drug). Indomethacin can quickly stop the pain from some kinds of headaches, such as:  Paroxysmal hemicrania. This is a series of short, severe headaches, usually on just one side of the head.  Hemicrania continua. Pain is nonstop and on one side of the face.  Primary exertional headache. Exercise sets off these headaches.  Primary cough headache. Pain may come from pressure in the brain when coughing or straining. What are the causes? The exact cause of this condition is not known. Certain conditions may start (trigger) a headache. They include:  Moving the head in certain ways.  Stress.  Pressure on sensitive areas of the neck.  Drinking alcohol.  Exercise.  Coughing and sneezing. What increases the risk? The following factors may make you more likely to develop this condition:  Being 10930 years of age or older.  Having a serious head injury.  Having migraine headaches.  Having a family history of this condition. What are the signs or symptoms? Symptoms of this condition depend on the kind of headache you have.  Paroxysmal hemicrania: ? Having about 10 headaches a day. Each may last from a few minutes to 2 hours. ? Severe, pounding pain. ? Pain usually on just one side of the head. It often centers around the eye or in  the forehead. ? A watery eye, which may become red or swollen. ? A droopy or swollen eyelid. ? Sweating and having a red or pinkish face. ? A stuffy, runny nose.  Hemicrania continua: ? All-day headache. This may occur daily for at least 3 months. Then there may be no headaches for weeks or months. ? Pain that gets worse several times during the day. ? Pain in the face, on one side only. It almost always occurs on the same side. ? A watery eye. It may also become droopy, red, and swollen. ? A stuffy, runny nose. ? Pain that gets worse with sound or light.  Primary exertional headache: ? Pain during physical activity. ? Pounding or throbbing pain. ? Pain that lasts for 5 minutes to 48 hours, or sometimes longer.  Primary cough headache: ? Pain that starts after coughing, sneezing, or straining. ? Sharp, stabbing pain. ? Pain on both sides of the head. It is often worse in the back of the head. ? Pain that is severe for a few minutes and then dull for several hours. How is this diagnosed? This condition may be diagnosed based on:  Symptoms and medical history. Your health care provider will ask you questions about your headaches.  Physical exam.  Tests that may include: ? Blood and urine tests. ? Spinal tap (lumbar puncture). This tests a sample of fluid from your spine. The test checks for infection, bleeding in your brain (brain hemorrhage), or extra pressure inside your skull. ? Ultrasound, MRI, CT scan, or other  imaging tests. If no medical condition is causing your headaches, you will be given indomethacin. If yours is an indomethacin-responsive headache, your symptoms should go away quickly. How is this treated?  By taking indomethacin.  With other medicines: ? To prevent or treat stomach ulcers. ? To relieve stomachache or heartburn (antacids). ? For nausea. Follow these instructions at home: Lifestyle  Rest in a dark, quiet room.  Put a cool, damp washcloth on  your head or face.  Get plenty of sleep. Most adults should get at least 7-9 hours of sleep each night.  Eat on a regular schedule. Do not skip meals.  Limit alcohol intake to no more than 1 drink per day for non-pregnant women and 2 drinks per day for men. One drink equals 12 ounces of beer, 5 ounces of wine, or 1 ounces of hard liquor.  Do not use any products that contain nicotine or tobacco, such as cigarettes and e-cigarettes. If you need help quitting, ask your health care provider. Headache diary Keep a headache diary. This will help you and your health care provider determine what is triggering your headaches. Each time you have a headache, write down:  When it started and stopped. Include the day and time.  How it felt.  Any triggers, such as noise, stress, or foods.  Any medicines you took.  General instructions  Take over-the-counter and prescription medicines only as told by your health care provider. ? Do not take other NSAIDs, such as ibuprofen, with indomethacin.  Tell your health care provider about all medicines you are taking, including vitamins, herbs, eye drops, creams, and over-the-counter medicines.  Keep all follow-up visits as told by your health care provider. This is important. Contact a health care provider if:  Your pain continues even with treatment.  You have nausea.  You have a fever. Get help right away if:  You have bad stomach pain or vomiting.  You vomit blood.  You have blood in your stool.  You have chest pain.  You have any symptoms of a stroke. "BE FAST" is an easy way to remember the main warning signs of a stroke: ? B - Balance. Signs are dizziness, sudden trouble walking, or loss of balance. ? E - Eyes. Signs are trouble seeing or a sudden change in vision. ? F - Face. Signs are sudden weakness or numbness of the face, or the face or eyelid drooping on one side. ? A - Arms. Signs are weakness or numbness in an arm. This  happens suddenly and usually on one side of the body. ? S - Speech. Signs are sudden trouble speaking, slurred speech, or trouble understanding what people say. ? T - Time. Time to call emergency services. Write down what time symptoms started.  You have other signs of a stroke, such as: ? A sudden, severe headache. ? Nausea or vomiting. ? Seizure. Summary  An indomethacin-responsive headache is a headache that gets better when you take indomethacin, a medicine that stops inflammation.  The exact cause of this condition is not known, but there are certain conditions that may start (trigger) a headache.  Keep a headache diary to help your health care provider determine your triggers.  Treatment of this condition includes indomethacin, but it may include other medicines to relieve other symptoms. This information is not intended to replace advice given to you by your health care provider. Make sure you discuss any questions you have with your health care provider. Document Released: 05/04/2009  Document Revised: 07/09/2018 Document Reviewed: 05/27/2017 Elsevier Patient Education  2020 [employer]  Indomethacin capsules What is this medicine? INDOMETHACIN (in doe METH a sin) is a non-steroidal anti-inflammatory drug (NSAID). It is used to reduce swelling and to treat pain. It may be used for painful joint and muscular problems such as arthritis, tendinitis, bursitis, and gout. This medicine may be used for other purposes; ask your health care provider or pharmacist if you have questions. COMMON BRAND NAME(S): Indocin, TIVORBEX What should I tell my health care provider before I take this medicine? They need to know if you have any of these conditions:  asthma, especially aspirin sensitive asthma  coronary artery bypass graft (CABG) surgery within the past 2 weeks  depression  drink more than 3 alcohol containing drinks a day  heart disease or circulation problems like heart  failure or leg edema (fluid retention)  high blood pressure  kidney disease  liver disease  Parkinson's disease  seizures  stomach bleeding or ulcers  an unusual or allergic reaction to indomethacin, aspirin, other NSAIDs, other medicines, foods, dyes, or preservatives  pregnant or trying to get pregnant  breast-feeding How should I use this medicine? Take this medicine by mouth with food and with a full glass of water. Follow the directions on the prescription label. Take your medicine at regular intervals. Do not take your medicine more often than directed. Long-term, continuous use may increase the risk of heart attack or stroke. A special MedGuide will be given to you by the pharmacist with each prescription and refill. Be sure to read this information carefully each time. Talk to your pediatrician regarding the use of this medicine in children. Special care may be needed. While this drug may be prescribed for children as young as 15 years for selected conditions, precautions do apply. Elderly patients over 33 years old may have a stronger reaction and need a smaller dose. Overdosage: If you think you have taken too much of this medicine contact a poison control center or emergency room at once. NOTE: This medicine is only for you. Do not share this medicine with others. What if I miss a dose? If you miss a dose, take it as soon as you can. If it is almost time for your next dose, take only that dose. Do not take double or extra doses. What may interact with this medicine? Do not take this medicine with any of the following medications:  cidofovir  diflunisal  ketorolac  methotrexate  pemetrexed  triamterene This medicine may also interact with the following medications:  alcohol  antacids  aspirin and aspirin-like medicines  cyclosporine  digoxin  diuretics  lithium  medicines for diabetes  medicines for high blood pressure  medicines that affect  platelets  medicines that treat or prevent blood clots like warfarin  NSAIDs, medicines for pain and inflammation, like ibuprofen or naproxen  probenecid  steroid medicines like prednisone or cortisone This list may not describe all possible interactions. Give your health care provider a list of all the medicines, herbs, non-prescription drugs, or dietary supplements you use. Also tell them if you smoke, drink alcohol, or use illegal drugs. Some items may interact with your medicine. What should I watch for while using this medicine? Tell your doctor or healthcare provider if your pain does not get better. Talk to your doctor before taking another medicine for pain. Do not treat yourself. This medicine may cause serious skin reactions. They can happen weeks to months after  starting the medicine. Contact your healthcare provider right away if you notice fevers or flu-like symptoms with a rash. The rash may be red or purple and then turn into blisters or peeling of the skin. Or, you might notice a red rash with swelling of the face, lips or lymph nodes in your neck or under your arms. This medicine does not prevent heart attack or stroke. In fact, this medicine may increase the chance of a heart attack or stroke. The chance may increase with longer use of this medicine and in people who have heart disease. If you take aspirin to prevent heart attack or stroke, talk with your doctor or healthcare provider. Do not take medicines such as ibuprofen and naproxen with this medicine. Side effects such as stomach upset, nausea, or ulcers may be more likely to occur. Many medicines available without a prescription should not be taken with this medicine. This medicine can cause ulcers and bleeding in the stomach and intestines at any time during treatment. Do not smoke cigarettes or drink alcohol. These increase irritation to your stomach and can make it more susceptible to damage from this medicine. Ulcers and  bleeding can happen without warning symptoms and can cause death. You may get drowsy or dizzy. Do not drive, use machinery, or do anything that needs mental alertness until you know how this medicine affects you. Do not stand or sit up quickly, especially if you are an older patient. This reduces the risk of dizzy or fainting spells. This medicine can cause you to bleed more easily. Try to avoid damage to your teeth and gums when you brush or floss your teeth. What side effects may I notice from receiving this medicine? Side effects that you should report to your doctor or health care professional as soon as possible:  allergic reactions like skin rash, itching or hives, swelling of the face, lips, or tongue  difficulty breathing or wheezing  nausea, vomiting  redness, blistering, peeling, or loosening of the skin, including inside the mouth  signs and symptoms of bleeding such as bloody or black, tarry stools; red or dark-brown urine; spitting up blood or brown material that looks like coffee grounds; red spots on the skin; unusual bruising or bleeding from the eye, gums, or nose  signs and symptoms of a blood clot such as changes in vision; chest pain; severe, sudden headache; trouble speaking; sudden numbness or weakness of the face, arm, or leg; trouble walking  unexplained weight gain or swelling  unusually weak or tired  yellowing of eyes or skin Side effects that usually do not require medical attention (report to your doctor or health care professional if they continue or are bothersome):  diarrhea  dizziness  headache  heartburn This list may not describe all possible side effects. Call your doctor for medical advice about side effects. You may report side effects to FDA at 1-800-FDA-1088. Where should I keep my medicine? Keep out of the reach of children. Store at room temperature between 15 and 30 degrees C (59 and 86 degrees F). Keep container tightly closed. Throw away  any unused medicine after the expiration date. NOTE: This sheet is a summary. It may not cover all possible information. If you have questions about this medicine, talk to your doctor, pharmacist, or health care provider.  2020 Elsevier/Gold Standard (2018-08-01 14:57:14)

## 2019-02-27 ENCOUNTER — Telehealth: Payer: Self-pay | Admitting: Neurology

## 2019-02-27 NOTE — Telephone Encounter (Signed)
no to the covid questions MR Brain w/wo contrast Dr. Ihor Dow Auth: [id number] (exp. 02/26/19 to 03/27/19). Patient is scheduled at [facility] for 03/06/19.

## 2019-03-06 ENCOUNTER — Ambulatory Visit: Payer: BC Managed Care – PPO

## 2019-03-06 ENCOUNTER — Other Ambulatory Visit: Payer: Self-pay

## 2019-03-06 DIAGNOSIS — R519 Headache, unspecified: Secondary | ICD-10-CM | POA: Diagnosis not present

## 2019-03-06 DIAGNOSIS — G441 Vascular headache, not elsewhere classified: Secondary | ICD-10-CM

## 2019-03-06 DIAGNOSIS — G44039 Episodic paroxysmal hemicrania, not intractable: Secondary | ICD-10-CM | POA: Diagnosis not present

## 2019-03-06 MED ORDER — GADOBENATE DIMEGLUMINE 529 MG/ML IV SOLN
15.0000 mL | Freq: Once | INTRAVENOUS | Status: AC | PRN
  Administered 2019-03-06: 15 mL via INTRAVENOUS

## 2019-04-09 ENCOUNTER — Ambulatory Visit: Payer: BC Managed Care – PPO | Admitting: Pulmonary Disease

## 2019-09-24 DIAGNOSIS — Z1151 Encounter for screening for human papillomavirus (HPV): Secondary | ICD-10-CM | POA: Diagnosis not present

## 2019-09-24 DIAGNOSIS — Z6825 Body mass index (BMI) 25.0-25.9, adult: Secondary | ICD-10-CM | POA: Diagnosis not present

## 2019-09-24 DIAGNOSIS — Z01419 Encounter for gynecological examination (general) (routine) without abnormal findings: Secondary | ICD-10-CM | POA: Diagnosis not present

## 2019-11-21 DIAGNOSIS — D225 Melanocytic nevi of trunk: Secondary | ICD-10-CM | POA: Diagnosis not present

## 2019-11-21 DIAGNOSIS — L578 Other skin changes due to chronic exposure to nonionizing radiation: Secondary | ICD-10-CM | POA: Diagnosis not present

## 2019-11-21 DIAGNOSIS — Z808 Family history of malignant neoplasm of other organs or systems: Secondary | ICD-10-CM | POA: Diagnosis not present

## 2019-11-21 DIAGNOSIS — Z86018 Personal history of other benign neoplasm: Secondary | ICD-10-CM | POA: Diagnosis not present

## 2019-12-16 DIAGNOSIS — F324 Major depressive disorder, single episode, in partial remission: Secondary | ICD-10-CM | POA: Diagnosis not present

## 2020-02-26 DIAGNOSIS — Z1159 Encounter for screening for other viral diseases: Secondary | ICD-10-CM | POA: Diagnosis not present

## 2020-03-30 DIAGNOSIS — J3089 Other allergic rhinitis: Secondary | ICD-10-CM | POA: Diagnosis not present

## 2020-03-30 DIAGNOSIS — H1045 Other chronic allergic conjunctivitis: Secondary | ICD-10-CM | POA: Diagnosis not present

## 2020-03-30 DIAGNOSIS — J301 Allergic rhinitis due to pollen: Secondary | ICD-10-CM | POA: Diagnosis not present

## 2020-03-30 DIAGNOSIS — J309 Allergic rhinitis, unspecified: Secondary | ICD-10-CM | POA: Diagnosis not present

## 2020-06-29 DIAGNOSIS — Z1159 Encounter for screening for other viral diseases: Secondary | ICD-10-CM | POA: Diagnosis not present

## 2020-06-29 DIAGNOSIS — Z1152 Encounter for screening for COVID-19: Secondary | ICD-10-CM | POA: Diagnosis not present

## 2020-08-25 DIAGNOSIS — Z23 Encounter for immunization: Secondary | ICD-10-CM | POA: Diagnosis not present

## 2020-08-25 DIAGNOSIS — F324 Major depressive disorder, single episode, in partial remission: Secondary | ICD-10-CM | POA: Diagnosis not present

## 2020-08-25 DIAGNOSIS — R635 Abnormal weight gain: Secondary | ICD-10-CM | POA: Diagnosis not present

## 2020-08-25 DIAGNOSIS — J302 Other seasonal allergic rhinitis: Secondary | ICD-10-CM | POA: Diagnosis not present

## 2020-08-26 DIAGNOSIS — Z131 Encounter for screening for diabetes mellitus: Secondary | ICD-10-CM | POA: Diagnosis not present

## 2020-08-26 DIAGNOSIS — R635 Abnormal weight gain: Secondary | ICD-10-CM | POA: Diagnosis not present

## 2020-08-26 DIAGNOSIS — Z13220 Encounter for screening for lipoid disorders: Secondary | ICD-10-CM | POA: Diagnosis not present

## 2020-11-20 DIAGNOSIS — L578 Other skin changes due to chronic exposure to nonionizing radiation: Secondary | ICD-10-CM | POA: Diagnosis not present

## 2020-11-20 DIAGNOSIS — D2262 Melanocytic nevi of left upper limb, including shoulder: Secondary | ICD-10-CM | POA: Diagnosis not present

## 2020-11-20 DIAGNOSIS — D2361 Other benign neoplasm of skin of right upper limb, including shoulder: Secondary | ICD-10-CM | POA: Diagnosis not present

## 2020-11-20 DIAGNOSIS — L821 Other seborrheic keratosis: Secondary | ICD-10-CM | POA: Diagnosis not present

## 2022-08-15 NOTE — Progress Notes (Signed)
Benito Mccreedy D.[place] Phone: [phone]   Assessment and Plan:     1. Right hip pain 2. 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  My interpretation: No acute fracture or dislocation.  Unremarkable imaging. - Continue HEP and physical therapy focusing on hip flexors - Start meloxicam 15 mg daily x2 weeks.  If still having pain after 2 weeks, complete 3rd-week of meloxicam. May use remaining meloxicam as needed once daily for pain control.  Do not to use additional NSAIDs while taking meloxicam.  May use Tylenol [phone] mg 2 to 3 times a day for breakthrough pain.  Other orders - meloxicam (MOBIC) 15 MG tablet; Take 1 tablet (15 mg total) by mouth daily.    Pertinent previous records reviewed include none   Follow Up: 4 weeks for reevaluation.  If no improvement or worsening of symptoms, could consider ultrasound versus psoas tendon CSI   Subjective:   I, Savannah Schmitt, am serving as a Education administrator for Doctor Glennon Mac  Chief Complaint: hip pain   HPI:   08/19/2022 Patient is a 57 year old female complaining of hip pain. Patient states right hip pain , no pain with activity, standing and tennis, ache at night when she sleeps it wakes her at night , this weekend she was very active and when she went to sit she was sore , has been going to PT and dry needle that helped but she plateau, she feels like she is starting to get worse, if leg is extender an externally rotated she has relief , this pain has been going on for about 4-5 months, no numbness or tingling, pain is anterior iliopsoas,  ibu helps with the pain , it helps her sleep , she notes she is a side sleeper and that causes a lot of pain.   Relevant  Historical Information: None pertinent  Additional pertinent review of systems negative.   Current Outpatient Medications:    Fluticasone Propionate (FLONASE NA), Place into the nose daily., Disp: , Rfl:    indomethacin (INDOCIN) 25 MG capsule, Take 1-3 capsules (25-75 mg total) by mouth 3 (three) times daily as needed., Disp: 270 capsule, Rfl: 3   meloxicam (MOBIC) 15 MG tablet, Take 1 tablet (15 mg total) by mouth daily., Disp: 30 tablet, Rfl: 0   Multiple Vitamin (MULTIVITAMIN) tablet, Take 1 tablet by mouth as directed., Disp: , Rfl:    sertraline (ZOLOFT) 50 MG tablet, Take 25 mg by mouth at bedtime. , Disp: , Rfl:    Probiotic Product (PROBIOTIC DAILY PO), , Disp: , Rfl:    Objective:     Vitals:   08/19/22 0913  BP: 124/82  Pulse: 86  SpO2: 100%  Weight: 149 lb (67.6 kg)  Height: 5\' 5"  (1.651 m)      Body mass index is 24.79 kg/m.    Physical Exam:    General: awake, alert, and oriented no acute distress, nontoxic Skin: no suspicious lesions or rashes Neuro:sensation intact distally with no deficits, normal muscle tone, no atrophy, strength 5/5 in all tested lower ext groups Psych: normal mood and affect, speech clear   Right hip: No deformity, swelling or wasting ROM Flexion 90, ext 30, IR 45, ER 45 TTP hip flexors NTTP over the hip  flexors, greater trochanter, gluteal musculature, si joint, lumbar spine Thomas test with 0 fingerbreadths bilaterally Discomfort over anterior hip with palpation held over anterior hip and straight leg raise on right Negative log roll with FROM Negative FABER Negative FADIR Negative Piriformis test Negative trendelenberg Gait normal  No pain with quadricep stretch  Electronically signed by:  Benito Mccreedy D.[facility] 10:30 AM 08/19/22

## 2022-08-19 ENCOUNTER — Ambulatory Visit (INDEPENDENT_AMBULATORY_CARE_PROVIDER_SITE_OTHER): Payer: 59

## 2022-08-19 ENCOUNTER — Ambulatory Visit (INDEPENDENT_AMBULATORY_CARE_PROVIDER_SITE_OTHER): Payer: 59 | Admitting: Sports Medicine

## 2022-08-19 VITALS — BP 124/82 | HR 86 | Ht 65.0 in | Wt 149.0 lb

## 2022-08-19 DIAGNOSIS — M25551 Pain in right hip: Secondary | ICD-10-CM | POA: Diagnosis not present

## 2022-08-19 DIAGNOSIS — M7611 Psoas tendinitis, right hip: Secondary | ICD-10-CM | POA: Diagnosis not present

## 2022-08-19 MED ORDER — MELOXICAM 15 MG PO TABS: 15.0000 mg | ORAL_TABLET | Freq: Every day | ORAL | 0 refills | Status: AC

## 2022-08-19 NOTE — Patient Instructions (Addendum)
-   Start meloxicam 15 mg daily x2 weeks.  If still having pain after 2 weeks, complete 3rd-week of meloxicam. May use remaining meloxicam as needed once daily for pain control.  Do not to use additional NSAIDs while taking meloxicam.  May use Tylenol [phone] mg 2 to 3 times a day for breakthrough pain. Hip HEP Continue PT  4 week follow up

## 2022-09-15 ENCOUNTER — Other Ambulatory Visit: Payer: Self-pay | Admitting: Sports Medicine

## 2022-09-16 NOTE — Progress Notes (Unsigned)
    Savannah Schmitt D.[facility] [address] [place] [address] Phone: [phone]   Assessment and Plan:     There are no diagnoses linked to this encounter.  ***   Pertinent previous records reviewed include ***   Follow Up: ***     Subjective:   I, Savannah Schmitt, am serving as a Neurosurgeon for Doctor Richardean Sale   Chief Complaint: hip pain    HPI:    08/19/2022 Patient is a 57 year old female complaining of hip pain. Patient states right hip pain , no pain with activity, standing and tennis, ache at night when she sleeps it wakes her at night , this weekend she was very active and when she went to sit she was sore , has been going to PT and dry needle that helped but she plateau, she feels like she is starting to get worse, if leg is extender an externally rotated she has relief , this pain has been going on for about 4-5 months, no numbness or tingling, pain is anterior iliopsoas,  ibu helps with the pain , it helps her sleep , she notes she is a side sleeper and that causes a lot of pain.   09/19/2022 Patient states    Relevant Historical Information: None pertinent  Additional pertinent review of systems negative.   Current Outpatient Medications:    Fluticasone Propionate (FLONASE NA), Place into the nose daily., Disp: , Rfl:    indomethacin (INDOCIN) 25 MG capsule, Take 1-3 capsules (25-75 mg total) by mouth 3 (three) times daily as needed., Disp: 270 capsule, Rfl: 3   meloxicam (MOBIC) 15 MG tablet, Take 1 tablet (15 mg total) by mouth daily., Disp: 30 tablet, Rfl: 0   Multiple Vitamin (MULTIVITAMIN) tablet, Take 1 tablet by mouth as directed., Disp: , Rfl:    Probiotic Product (PROBIOTIC DAILY PO), , Disp: , Rfl:    sertraline (ZOLOFT) 50 MG tablet, Take 25 mg by mouth at bedtime. , Disp: , Rfl:    Objective:     There were no vitals filed for this visit.    There is no height or weight on file to calculate BMI.     Physical Exam:    ***   Electronically signed by:  Savannah Schmitt D.[facility] 7:23 AM 09/16/22

## 2022-09-19 ENCOUNTER — Ambulatory Visit (INDEPENDENT_AMBULATORY_CARE_PROVIDER_SITE_OTHER): Payer: 59 | Admitting: Sports Medicine

## 2022-09-19 ENCOUNTER — Ambulatory Visit: Payer: BC Managed Care – PPO | Admitting: Family Medicine

## 2022-09-19 VITALS — BP 102/82 | HR 81 | Ht 65.0 in | Wt 162.0 lb

## 2022-09-19 DIAGNOSIS — M7611 Psoas tendinitis, right hip: Secondary | ICD-10-CM

## 2022-09-19 DIAGNOSIS — M25551 Pain in right hip: Secondary | ICD-10-CM

## 2022-09-19 NOTE — Patient Instructions (Addendum)
Good to see you  Okay to do iontophoresis Discontinue meloxicam  Use tylenol as needed Continue HEP  Agree with starting yoga  As needed follow up
